# Patient Record
Sex: Female | Born: 1950 | Race: Black or African American | Hispanic: No | Smoking: Never smoker
Health system: Southern US, Community
[De-identification: ages and names within clinical notes are randomized; demographics above are authoritative.]

## PROBLEM LIST (undated history)

## (undated) DIAGNOSIS — I10 Essential (primary) hypertension: Secondary | ICD-10-CM

## (undated) HISTORY — DX: Essential (primary) hypertension: I10

---

## 1999-04-16 ENCOUNTER — Other Ambulatory Visit: Admission: RE | Admit: 1999-04-16 | Discharge: 1999-04-16 | Payer: Self-pay | Admitting: *Deleted

## 2000-06-22 ENCOUNTER — Ambulatory Visit (HOSPITAL_COMMUNITY): Admission: RE | Admit: 2000-06-22 | Discharge: 2000-06-22 | Payer: Self-pay | Admitting: *Deleted

## 2000-06-22 ENCOUNTER — Encounter: Payer: Self-pay | Admitting: *Deleted

## 2000-07-01 ENCOUNTER — Encounter: Payer: Self-pay | Admitting: *Deleted

## 2000-07-01 ENCOUNTER — Ambulatory Visit (HOSPITAL_COMMUNITY): Admission: RE | Admit: 2000-07-01 | Discharge: 2000-07-01 | Payer: Self-pay | Admitting: *Deleted

## 2001-06-09 ENCOUNTER — Other Ambulatory Visit: Admission: RE | Admit: 2001-06-09 | Discharge: 2001-06-09 | Payer: Self-pay | Admitting: *Deleted

## 2002-06-28 ENCOUNTER — Other Ambulatory Visit: Admission: RE | Admit: 2002-06-28 | Discharge: 2002-06-28 | Payer: Self-pay | Admitting: *Deleted

## 2004-01-22 ENCOUNTER — Ambulatory Visit (HOSPITAL_COMMUNITY): Admission: RE | Admit: 2004-01-22 | Discharge: 2004-01-22 | Payer: Self-pay | Admitting: Gastroenterology

## 2004-06-24 ENCOUNTER — Other Ambulatory Visit: Admission: RE | Admit: 2004-06-24 | Discharge: 2004-06-24 | Payer: Self-pay | Admitting: *Deleted

## 2005-10-28 ENCOUNTER — Other Ambulatory Visit: Admission: RE | Admit: 2005-10-28 | Discharge: 2005-10-28 | Payer: Self-pay | Admitting: *Deleted

## 2008-01-21 ENCOUNTER — Other Ambulatory Visit: Admission: RE | Admit: 2008-01-21 | Discharge: 2008-01-21 | Payer: Self-pay | Admitting: Gynecology

## 2010-12-20 NOTE — Op Note (Signed)
NAME:  Amanda Pennington, Amanda Pennington                         ACCOUNT NO.:  [id number]   MEDICAL RECORD NO.:  [id number]                   PATIENT TYPE:  AMB   LOCATION:  ENDO                                 FACILITY:  [facility]   PHYSICIAN:  Anselmo Rod, M.D.               DATE OF BIRTH:  [birth year]   DATE OF PROCEDURE:  01/22/2004  DATE OF DISCHARGE:                                 OPERATIVE REPORT   PROCEDURE PERFORMED:  Screening colonoscopy.   ENDOSCOPIST:  Charna Elizabeth, M.D.   INSTRUMENT USED:  Olympus video colonoscope.   INDICATIONS FOR PROCEDURE:  The patient is a 60 year old African-American  female undergoing screening colonoscopy to rule out colonic polyps, masses,  etc.   PREPROCEDURE PREPARATION:  Informed consent was procured from the patient.  The patient was fasted for eight hours prior to the procedure and prepped  with a bottle of magnesium citrate and a gallon of GoLYTELY the night prior  to the procedure.   PREPROCEDURE PHYSICAL:  The patient had stable vital signs.  Neck supple.  Chest clear to auscultation.  S1 and S2 regular.  Abdomen soft with normal  bowel sounds.   DESCRIPTION OF PROCEDURE:  The patient was placed in left lateral decubitus  position and sedated with 50 mg of Demerol and 7 mg of Versed intravenously.  Once the patient was adequately sedated and maintained on low flow oxygen  and continuous cardiac monitoring, the Olympus video colonoscope was  advanced from the rectum to the cecum.  The appendicular orifice and  ileocecal valve were clearly visualized and photographed.  The patient's  position had to be changed from the left lateral to the supine position with  gentle application of abdominal pressure to reach the cecal base.  The  terminal ileum appeared healthy and without lesion.  Retroflexion in the  rectum revealed no abnormalities.   IMPRESSION:  1. Normal colonoscopy to the terminal ileum.  No masses, polyps, or     diverticula were seen.  2. There was some residual stool in the colon and multiple washes were done.   RECOMMENDATIONS:  1. Repeat colonoscopy is recommended in the next 10 years unless the patient     develops any abnormal symptoms     in the interim.  2. Continue high fiber diet with liberal fluid intake.  3. Outpatient followup as need arises in the future.                                               Anselmo Rod, M.D.    JNM/MEDQ  D:  01/22/2004  T:  01/22/2004  Job:  [id number]   cc:   Talmadge Coventry, M.D.  [address]  [place]  [place] [address]  Fax: [phone]

## 2011-04-23 ENCOUNTER — Other Ambulatory Visit: Payer: Self-pay | Admitting: Family Medicine

## 2011-04-23 DIAGNOSIS — Z780 Asymptomatic menopausal state: Secondary | ICD-10-CM

## 2011-04-25 ENCOUNTER — Ambulatory Visit
Admission: RE | Admit: 2011-04-25 | Discharge: 2011-04-25 | Disposition: A | Discharge status: Home / Self Care | Payer: BC Managed Care – PPO | Source: Ambulatory Visit | Attending: Family Medicine | Admitting: Family Medicine

## 2011-04-25 DIAGNOSIS — Z780 Asymptomatic menopausal state: Secondary | ICD-10-CM

## 2012-01-30 ENCOUNTER — Ambulatory Visit (INDEPENDENT_AMBULATORY_CARE_PROVIDER_SITE_OTHER): Payer: BC Managed Care – PPO | Admitting: Family Medicine

## 2012-01-30 VITALS — BP 118/76 | HR 66 | Temp 98.2°F | Resp 16 | Ht 61.5 in | Wt 200.0 lb

## 2012-01-30 DIAGNOSIS — S61201A Unspecified open wound of left index finger without damage to nail, initial encounter: Secondary | ICD-10-CM

## 2012-01-30 DIAGNOSIS — S61209A Unspecified open wound of unspecified finger without damage to nail, initial encounter: Secondary | ICD-10-CM

## 2012-01-30 DIAGNOSIS — M79609 Pain in unspecified limb: Secondary | ICD-10-CM

## 2012-01-30 DIAGNOSIS — M79646 Pain in unspecified finger(s): Secondary | ICD-10-CM

## 2012-01-30 NOTE — Progress Notes (Signed)
Verbal consent obtained from the patient.  Local anesthesia with 4cc Lidocaine 2% without epinephrine.  Wound scrubbed with soap and water and rinsed.  Wound closed with 5 5-0 Prolene SI sutures.  Wound cleansed and dressed.

## 2012-01-30 NOTE — Patient Instructions (Addendum)

## 2012-01-30 NOTE — Progress Notes (Signed)
Subjective: Patient cut her left index finger with a trimmer.  She thinks her tetanus shot was up-to-date but she does not know that for sure. We will try to check on that.  Objective: Diagnosis slice of the distal pad of her left index finger. It looks fairly superficial. She has full range of motion of the finger. Color is good.  Assessment: Wound left index finger  Plan: Check on tetanus shot and see whether we need to give it. Will require sutures. Sandi Raveling PA we will perform this test.

## 2012-02-06 ENCOUNTER — Ambulatory Visit (INDEPENDENT_AMBULATORY_CARE_PROVIDER_SITE_OTHER): Payer: BC Managed Care – PPO | Admitting: Family Medicine

## 2012-02-06 VITALS — BP 128/82 | HR 65 | Temp 98.3°F | Resp 17 | Ht 62.0 in | Wt 202.0 lb

## 2012-02-06 DIAGNOSIS — Z4802 Encounter for removal of sutures: Secondary | ICD-10-CM

## 2012-02-06 NOTE — Progress Notes (Signed)
  [facility]:  Office Visit  Chief Complaint:  Chief Complaint  Patient presents with  . Suture / Staple Removal    HPI: Amanda Pennington is a 61 y.o. female who complains of : Here for suture removal, no loss of sensation, ROM intact.  Past Medical History  Diagnosis Date  . Hypertension    No past surgical history on file. History   Social History  . Marital Status: Legally Separated    Spouse Name: N/A    Number of Children: N/A  . Years of Education: N/A   Social History Main Topics  . Smoking status: Never Smoker   . Smokeless tobacco: None  . Alcohol Use: None  . Drug Use: None  . Sexually Active: None   Other Topics Concern  . None   Social History Narrative  . None   No family history on file. No Known Allergies Prior to Admission medications   Medication Sig Start Date End Date Taking? Authorizing Provider  amLODipine (NORVASC) 10 MG tablet Take 10 mg by mouth daily.   Yes Historical Provider, MD  Ascorbic Acid (VITAMIN C) 100 MG tablet Take 100 mg by mouth daily.   Yes Historical Provider, MD  aspirin 81 MG tablet Take 81 mg by mouth daily.   Yes Historical Provider, MD  cholecalciferol (VITAMIN D) 1000 UNITS tablet Take 1,000 Units by mouth daily.   Yes Historical Provider, MD  fish oil-omega-3 fatty acids 1000 MG capsule Take 2 g by mouth daily.   Yes Historical Provider, MD  hydrochlorothiazide (MICROZIDE) 12.5 MG capsule Take 12.5 mg by mouth daily.   Yes Historical Provider, MD  vitamin E 100 UNIT capsule Take 100 Units by mouth daily.   Yes Historical Provider, MD     ROS: The patient denies fevers, chills, night sweats, unintentional weight loss, chest pain, palpitations, wheezing, dyspnea on exertion, nausea, vomiting, abdominal pain, dysuria, hematuria, melena, numbness, weakness, or tingling.  All other systems have been reviewed and were otherwise negative with the exception of those mentioned in the HPI and as above.     PHYSICAL EXAM: Filed Vitals:   02/06/12 1317  BP: 128/82  Pulse: 65  Temp: 98.3 F (36.8 C)  Resp: 17   Filed Vitals:   02/06/12 1317  Height: 5\' 2"  (1.575 m)  Weight: 202 lb (91.627 kg)   Body mass index is 36.95 kg/(m^2).  General: Alert, no acute distress HEENT:  Normocephalic, atraumatic, oropharynx patent.  Cardiovascular:  Regular rate and rhythm, no rubs murmurs or gallops.  No Carotid bruits, radial pulse intact. No pedal edema.  Respiratory: Clear to auscultation bilaterally.  No wheezes, rales, or rhonchi.  No cyanosis, no use of accessory musculature GI: No organomegaly, abdomen is soft and non-tender, positive bowel sounds.  No masses. Skin: No rashes. Neurologic: Facial musculature symmetric. Psychiatric: Patient is appropriate throughout our interaction. Lymphatic: No cervical lymphadenopathy Musculoskeletal: Gait intact. Left index finger: Wound healing well. Good approxaimtion, dry no daiange,   LABS: No results found for this or any previous visit.   EKG/XRAY:   Primary read interpreted by Dr. Conley Rolls at [facility].   ASSESSMENT/PLAN: Encounter Diagnosis  Name Primary?  . Visit for suture removal Yes    Sutures removed without complications.   ,  PHUONG, DO 02/08/2012 1:00 PM

## 2012-02-08 ENCOUNTER — Encounter: Payer: Self-pay | Admitting: Family Medicine

## 2017-09-22 ENCOUNTER — Ambulatory Visit: Payer: BC Managed Care – PPO | Admitting: Internal Medicine

## 2017-09-22 ENCOUNTER — Ambulatory Visit (INDEPENDENT_AMBULATORY_CARE_PROVIDER_SITE_OTHER)
Admission: RE | Admit: 2017-09-22 | Discharge: 2017-09-22 | Disposition: A | Discharge status: Home / Self Care | Payer: BC Managed Care – PPO | Source: Ambulatory Visit | Attending: Internal Medicine | Admitting: Internal Medicine

## 2017-09-22 ENCOUNTER — Encounter: Payer: Self-pay | Admitting: Internal Medicine

## 2017-09-22 VITALS — BP 132/82 | HR 84 | Ht 62.0 in | Wt 201.6 lb

## 2017-09-22 DIAGNOSIS — J45991 Cough variant asthma: Secondary | ICD-10-CM | POA: Diagnosis not present

## 2017-09-22 MED ORDER — MOMETASONE FURO-FORMOTEROL FUM 100-5 MCG/ACT IN AERO: 2.0000 | INHALATION_SPRAY | Freq: Two times a day (BID) | RESPIRATORY_TRACT | 11 refills | Status: DC

## 2017-09-22 MED ORDER — MOMETASONE FURO-FORMOTEROL FUM 100-5 MCG/ACT IN AERO: 2.0000 | INHALATION_SPRAY | Freq: Two times a day (BID) | RESPIRATORY_TRACT | 0 refills | Status: DC

## 2017-09-22 NOTE — Progress Notes (Signed)
Subjective:     Patient ID: Amanda Pennington, female   DOB: 06-Dec-1950,    MRN: [id number]  HPI  54 yobf never smoker with allergy upper airway since childhood spring = fall then around late 1990's sob /coughing winter > [place] > dx mold allergy/asthma but p mold removed /  spring / fall cough always better with prednisone. eval by [facility] 01/2017 (neg per pt/ records req) so referred to pulmonary clinic 09/22/2017 by Dr   Kateri Plummer   09/22/2017 1st [facility] office visit/ Amanda Pennington   Chief Complaint  Patient presents with  . Pulmonary Consult    Referred by Dr. Farris Has. Pt c/o occ DOE. She has had a cough in the spring time for several years.    periodic sob  Usually assoc with cough last time while in [place] fall 2018  Zyrtec working plus flonase to control nasal symptoms and states presently not sob or coughing but wants to know how to handle it in future/ does not have inhaler.  No obvious day to day or daytime variability or assoc excess/ purulent sputum or mucus plugs or hemoptysis or cp or chest tightness, subjective wheeze or overt sinus or hb symptoms. No unusual exposure hx or h/o childhood pna/ asthma or knowledge of premature birth.  Sleeping ok flat without nocturnal  or early am exacerbation  of respiratory  c/o's or need for noct saba. Also denies any obvious fluctuation of symptoms with weather or environmental changes or other aggravating or alleviating factors except as outlined above   Current Allergies, Complete Past Medical History, Past Surgical History, Family History, and Social History were reviewed in [employer] record.  ROS  The following are not active complaints unless bolded Hoarseness, sore throat, dysphagia, dental problems, itching, sneezing,  nasal congestion or discharge of excess mucus or purulent secretions, ear ache,   fever, chills, sweats, unintended wt loss or wt gain, classically pleuritic or exertional cp,  orthopnea pnd  or leg swelling, presyncope, palpitations, abdominal pain, anorexia, nausea, vomiting, diarrhea  or change in bowel habits or change in bladder habits, change in stools or change in urine, dysuria, hematuria,  rash, arthralgias, visual complaints, headache, numbness, weakness or ataxia or problems with walking or coordination,  change in mood/affect or memory.        Current Meds  Medication Sig  . Ascorbic Acid (VITAMIN C) 100 MG tablet Take 100 mg by mouth daily.  Marland Kitchen aspirin 81 MG tablet Take 81 mg by mouth daily.  . B Complex-C (SUPER B COMPLEX PO) Take 1 capsule by mouth daily.  . cetirizine (ZYRTEC) 10 MG tablet Take 10 mg by mouth daily.  . cholecalciferol (VITAMIN D) 1000 UNITS tablet Take 1,000 Units by mouth daily.  . Cyanocobalamin (B-12 PO) Take 1 tablet by mouth daily.  . Flaxseed, Linseed, (FLAXSEED OIL PO) Take 1 capsule by mouth daily.  . fluticasone (FLONASE) 50 MCG/ACT nasal spray Place 2 sprays into both nostrils daily.  Marland Kitchen FOLIC ACID PO Take 1 tablet by mouth daily.  Marland Kitchen losartan-hydrochlorothiazide (HYZAAR) 50-12.5 MG tablet Take 1 tablet by mouth daily.  . vitamin A 78295 UNIT capsule Take 10,000 Units by mouth daily.  . vitamin E 100 UNIT capsule Take 100 Units by mouth daily.  Marland Kitchen zinc gluconate 50 MG tablet Take 50 mg by mouth daily.                  Review of Systems  Objective:   Physical Exam    amb bf nad   Wt Readings from Last 3 Encounters:  09/22/17 201 lb 9.6 oz (91.4 kg)  02/06/12 202 lb (91.6 kg)  01/30/12 200 lb (90.7 kg)     Vital signs reviewed - Note on arrival 02 sats  97% on RA   HEENT: nl dentition, turbinates bilaterally, and oropharynx. Nl external ear canals without cough reflex   NECK :  without JVD/Nodes/TM/ nl carotid upstrokes bilaterally   LUNGS: no acc muscle use,  Nl contour chest which is clear to A and P bilaterally without cough on insp or exp maneuvers   CV:  RRR  no s3 or murmur or increase in P2, and no  edema   ABD:  soft and nontender with nl inspiratory excursion in the supine position. No bruits or organomegaly appreciated, bowel sounds nl  MS:  Nl gait/ ext warm without deformities, calf tenderness, cyanosis or clubbing No obvious joint restrictions   SKIN: warm and dry without lesions    NEURO:  alert, approp, nl sensorium with  no motor or cerebellar deficits apparent.    CXR PA and Lateral:   09/22/2017 :    I personally reviewed images and agree with radiology impression as follows:    Shallow degree of inspiration. Left base atelectasis. Lungs elsewhere clear. There is aortic atherosclerosis.  My review: poor inspiration/ no findings       Assessment:

## 2017-09-22 NOTE — Patient Instructions (Addendum)
For next flare rec trial of dulera 100  up to 2 puffs every 12 hours if needed for cough or short of breath   Work on inhaler technique:  relax and gently blow all the way out then take a nice smooth deep breath back in, triggering the inhaler at same time you start breathing in.  Hold for up to 5 seconds if you can. Blow out thru nose. Rinse and gargle with water when done   Please remember to go to the  x-ray department downstairs in the basement  for your tests - we will call you with the results when they are available.  Please sign release for your allergy evaluation in 2018 so we can have a record of it here

## 2017-09-23 ENCOUNTER — Encounter: Payer: Self-pay | Admitting: Internal Medicine

## 2017-09-23 NOTE — Assessment & Plan Note (Addendum)
Spirometry 09/22/2017  Nl with nl curvature to fv FENO 09/22/2017  =   Not able to do - 09/22/2017  After extensive coaching inhaler device  effectiveness =    75% from baselin 25% so try dulera 100 2bid @ next flare of symptoms   Allergy Eval June 2018 "neg" per pt/ req 09/22/2017     The only abn today is small lungs on cxr but not lung vol nl on spirometry so suspect this was effort related.   She likely has intermittent asthma / cough variant triggered by rhinitis/ pnds/ ? Allergy vs viral vs irritant but in any case best option is low dose dulera or symb prn  Based on the study from NEJM  378; 20 p 1865 (2018) in pts with mild asthma it is reasonable to use low dose symbicort eg 80 2bid "prn" flare in this setting but I emphasized this was only shown with symbicort and takes advantage of the rapid onset of action (of formoterol component, same as in dulera)  but is not the same as "rescue therapy" but can be stopped once the acute symptoms have resolved and the need for rescue has been minimized (< 2 x weekly)    - The proper method of use, as well as anticipated side effects, of a metered-dose inhaler are discussed and demonstrated to the patient. Improved effectiveness after extensive coaching during this visit to a level of approximately 75 % from a baseline of 25 % so if not doing well first step is verify approp use of hfa   Total time devoted to counseling  > 50 % of initial 60 min office visit:  review case with pt/ discussion of options/alternatives/ personally creating written customized instructions  in presence of pt  then going over those specific  Instructions directly with the pt including how to use all of the meds but in particular covering each new medication in detail and the difference between the maintenance= "automatic" meds and the prns using an action plan format for the latter (If this problem/symptom => do that organization reading Left to right).  Please see AVS from this  visit for a full list of these instructions which I personally wrote for this pt and  are unique to this visit.

## 2017-09-23 NOTE — Progress Notes (Signed)
Spoke with pt and notified of results per Dr. Wert. Pt verbalized understanding and denied any questions. 

## 2017-12-02 ENCOUNTER — Telehealth: Payer: Self-pay

## 2017-12-02 DIAGNOSIS — J45991 Cough variant asthma: Secondary | ICD-10-CM

## 2017-12-02 MED ORDER — MOMETASONE FURO-FORMOTEROL FUM 100-5 MCG/ACT IN AERO: 2.0000 | INHALATION_SPRAY | Freq: Two times a day (BID) | RESPIRATORY_TRACT | 11 refills | Status: AC

## 2017-12-02 NOTE — Telephone Encounter (Signed)
Called and spoke with Dora. PA was initiaVerlee Monteed over the phone. Clinical information given and questions answered. Advised that patient has tried and done well on the [facility] inhaler. Medication has been approved through 5.1.20  Case number [id number]  Called and advised pharmacy that medication was approved. Advised patient that this was approved and sent in. Nothing further needed.

## 2018-09-20 IMAGING — DX DG CHEST 2V
2 series · 2 of 2 positions shown · non-contrast
Comparison: None.

CLINICAL DATA: Shortness of breath.  Hypertension.  Cough.

EXAM:
CHEST  2 VIEW

[chest pa]
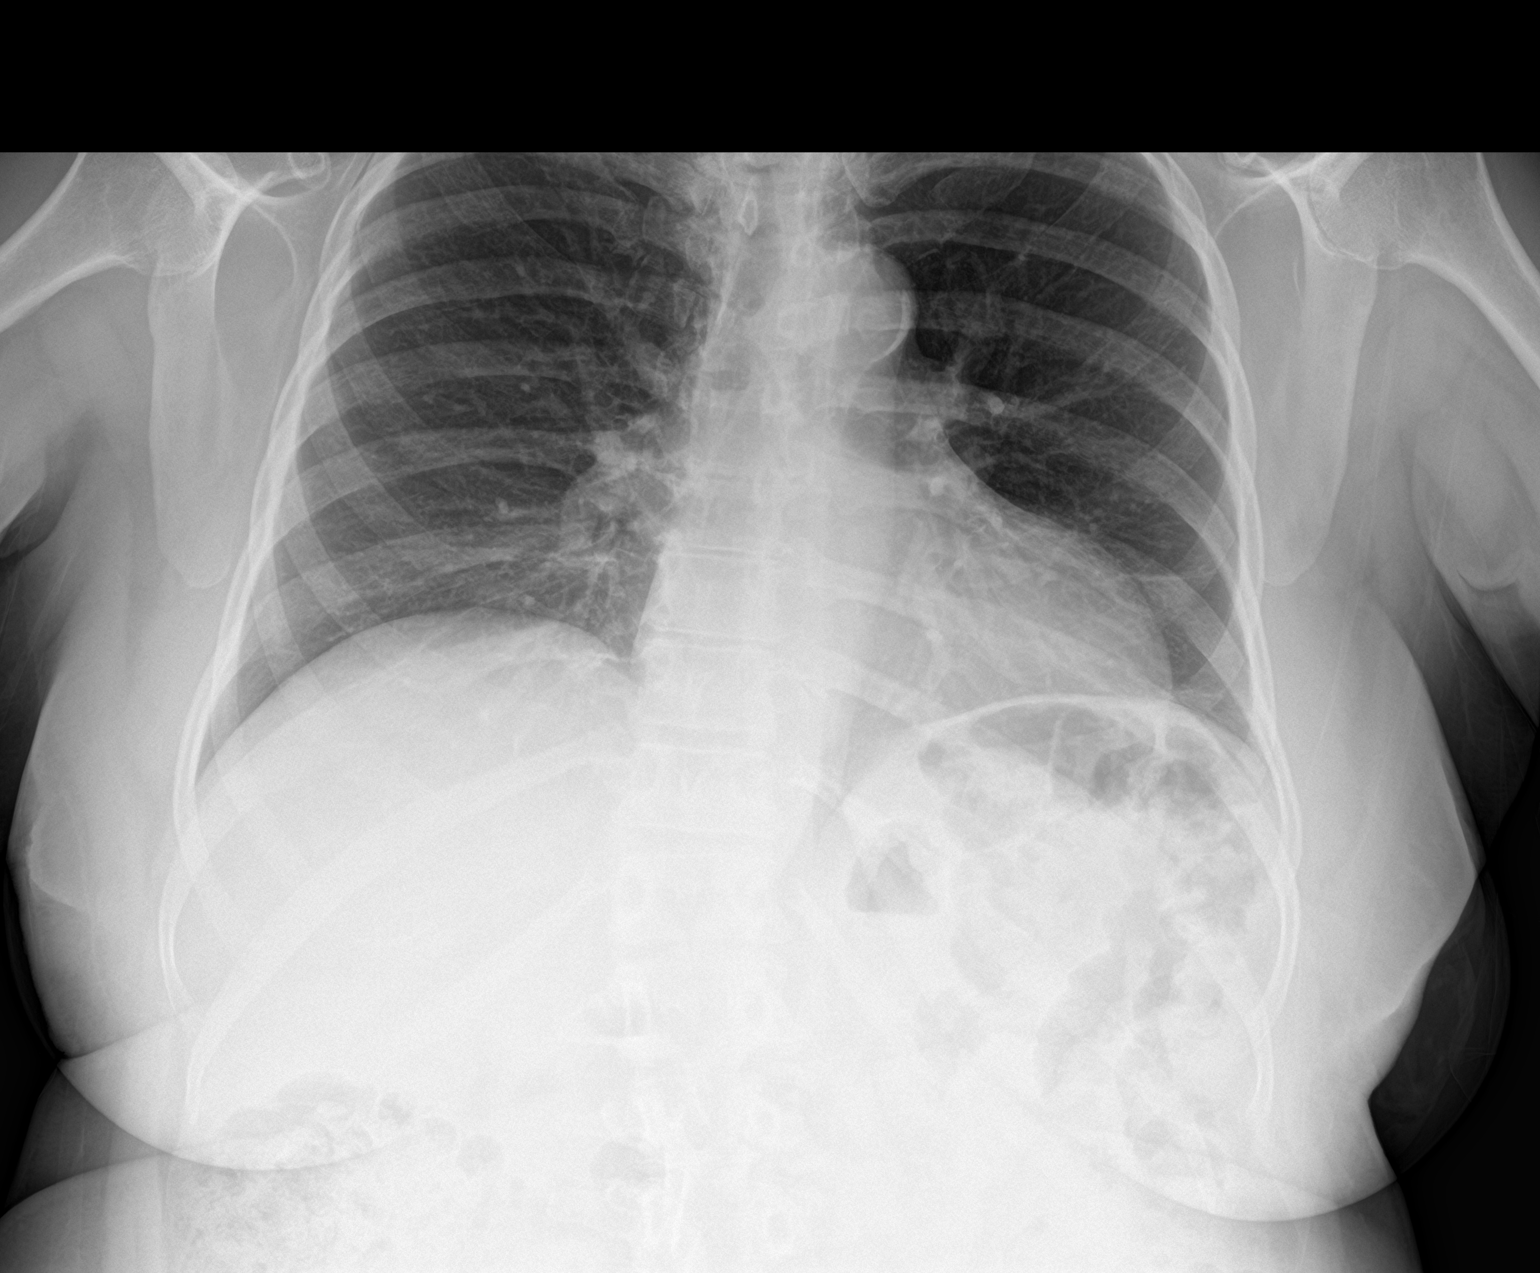

[chest lat]
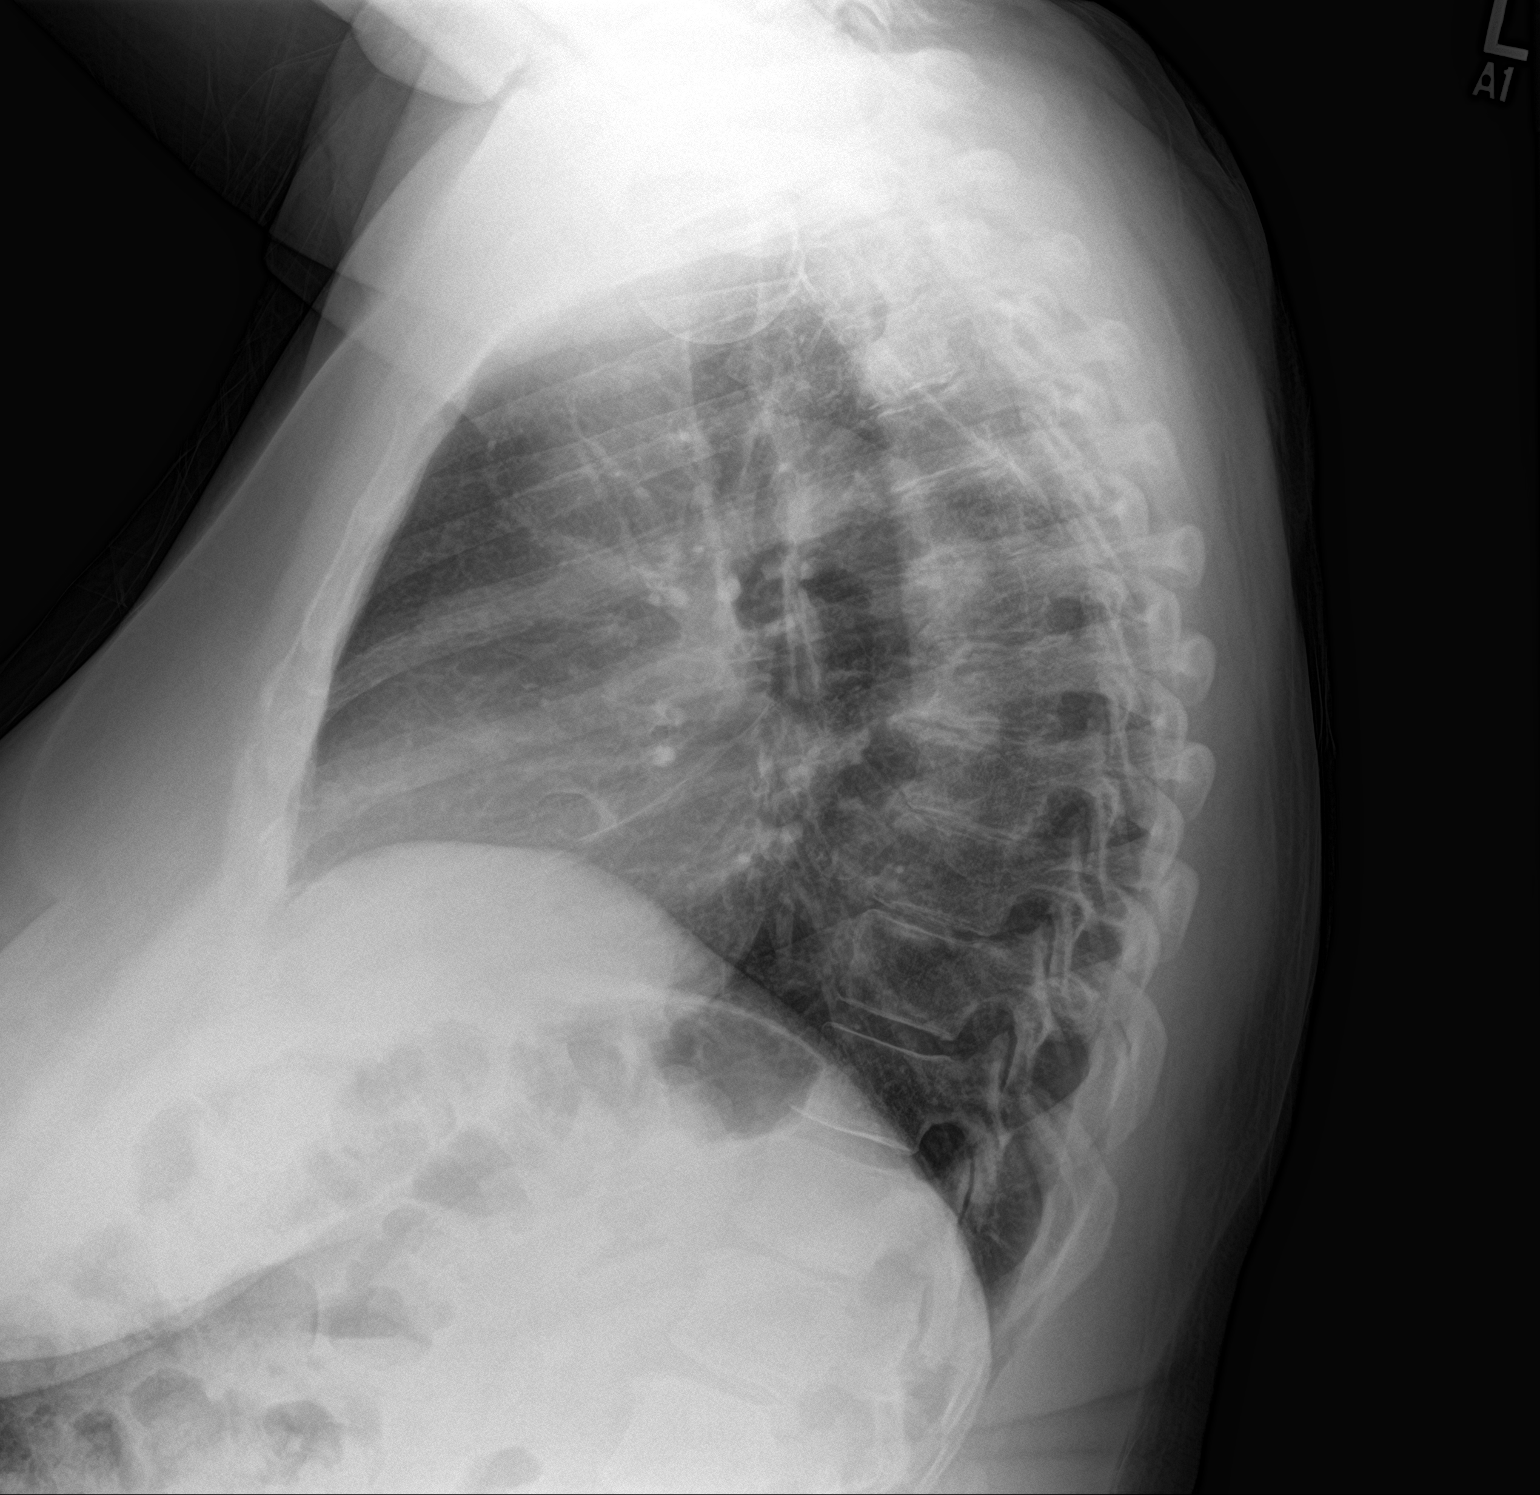

[2 of 2 positions shown; findings below may reference images not displayed]

FINDINGS: Degree of inspiration is shallow. There is atelectatic change in the
left base. Lungs elsewhere are clear. Heart size and pulmonary
vascularity are normal. No adenopathy. There is aortic
atherosclerosis. There is degenerative change in the thoracic spine.
IMPRESSION: Shallow degree of inspiration. Left base atelectasis. Lungs
elsewhere clear. There is aortic atherosclerosis.

Aortic Atherosclerosis ([id number]-LOI.I).

## 2020-04-30 ENCOUNTER — Encounter (INDEPENDENT_AMBULATORY_CARE_PROVIDER_SITE_OTHER): Payer: Self-pay

## 2020-04-30 DIAGNOSIS — Z20828 Contact with and (suspected) exposure to other viral communicable diseases: Secondary | ICD-10-CM | POA: Diagnosis not present

## 2020-05-03 DIAGNOSIS — L80 Vitiligo: Secondary | ICD-10-CM | POA: Diagnosis not present

## 2020-05-03 DIAGNOSIS — L821 Other seborrheic keratosis: Secondary | ICD-10-CM | POA: Diagnosis not present

## 2020-06-04 DIAGNOSIS — Z6832 Body mass index (BMI) 32.0-32.9, adult: Secondary | ICD-10-CM | POA: Diagnosis not present

## 2020-06-04 DIAGNOSIS — Z779 Other contact with and (suspected) exposures hazardous to health: Secondary | ICD-10-CM | POA: Diagnosis not present

## 2020-06-04 DIAGNOSIS — Z1231 Encounter for screening mammogram for malignant neoplasm of breast: Secondary | ICD-10-CM | POA: Diagnosis not present

## 2020-06-05 DIAGNOSIS — Z1272 Encounter for screening for malignant neoplasm of vagina: Secondary | ICD-10-CM | POA: Diagnosis not present

## 2020-07-26 DIAGNOSIS — L80 Vitiligo: Secondary | ICD-10-CM | POA: Diagnosis not present

## 2020-08-06 DIAGNOSIS — I10 Essential (primary) hypertension: Secondary | ICD-10-CM | POA: Diagnosis not present

## 2020-08-06 DIAGNOSIS — L80 Vitiligo: Secondary | ICD-10-CM | POA: Diagnosis not present

## 2020-08-06 DIAGNOSIS — J4520 Mild intermittent asthma, uncomplicated: Secondary | ICD-10-CM | POA: Diagnosis not present

## 2020-08-06 DIAGNOSIS — R7303 Prediabetes: Secondary | ICD-10-CM | POA: Diagnosis not present

## 2020-10-25 DIAGNOSIS — L80 Vitiligo: Secondary | ICD-10-CM | POA: Diagnosis not present

## 2021-02-11 DIAGNOSIS — I10 Essential (primary) hypertension: Secondary | ICD-10-CM | POA: Diagnosis not present

## 2021-02-11 DIAGNOSIS — Z0000 Encounter for general adult medical examination without abnormal findings: Secondary | ICD-10-CM | POA: Diagnosis not present

## 2021-02-11 DIAGNOSIS — R7303 Prediabetes: Secondary | ICD-10-CM | POA: Diagnosis not present

## 2021-02-11 DIAGNOSIS — J4520 Mild intermittent asthma, uncomplicated: Secondary | ICD-10-CM | POA: Diagnosis not present

## 2021-02-11 DIAGNOSIS — Z1211 Encounter for screening for malignant neoplasm of colon: Secondary | ICD-10-CM | POA: Diagnosis not present

## 2021-02-20 DIAGNOSIS — Z1211 Encounter for screening for malignant neoplasm of colon: Secondary | ICD-10-CM | POA: Diagnosis not present

## 2021-03-07 DIAGNOSIS — L80 Vitiligo: Secondary | ICD-10-CM | POA: Diagnosis not present

## 2021-07-11 DIAGNOSIS — N952 Postmenopausal atrophic vaginitis: Secondary | ICD-10-CM | POA: Diagnosis not present

## 2021-07-11 DIAGNOSIS — N950 Postmenopausal bleeding: Secondary | ICD-10-CM | POA: Diagnosis not present

## 2021-07-11 DIAGNOSIS — Z1231 Encounter for screening mammogram for malignant neoplasm of breast: Secondary | ICD-10-CM | POA: Diagnosis not present

## 2021-07-11 DIAGNOSIS — Z124 Encounter for screening for malignant neoplasm of cervix: Secondary | ICD-10-CM | POA: Diagnosis not present

## 2021-07-11 DIAGNOSIS — Z6830 Body mass index (BMI) 30.0-30.9, adult: Secondary | ICD-10-CM | POA: Diagnosis not present

## 2021-07-12 DIAGNOSIS — Z1272 Encounter for screening for malignant neoplasm of vagina: Secondary | ICD-10-CM | POA: Diagnosis not present

## 2021-08-22 DIAGNOSIS — R87610 Atypical squamous cells of undetermined significance on cytologic smear of cervix (ASC-US): Secondary | ICD-10-CM | POA: Diagnosis not present

## 2021-08-22 DIAGNOSIS — R87619 Unspecified abnormal cytological findings in specimens from cervix uteri: Secondary | ICD-10-CM | POA: Diagnosis not present

## 2021-08-27 DIAGNOSIS — U071 COVID-19: Secondary | ICD-10-CM | POA: Diagnosis not present

## 2021-09-13 DIAGNOSIS — I700 Atherosclerosis of aorta: Secondary | ICD-10-CM | POA: Diagnosis not present

## 2021-09-13 DIAGNOSIS — I10 Essential (primary) hypertension: Secondary | ICD-10-CM | POA: Diagnosis not present

## 2021-09-13 DIAGNOSIS — R7303 Prediabetes: Secondary | ICD-10-CM | POA: Diagnosis not present

## 2021-09-13 DIAGNOSIS — R413 Other amnesia: Secondary | ICD-10-CM | POA: Diagnosis not present

## 2022-02-07 DIAGNOSIS — W57XXXA Bitten or stung by nonvenomous insect and other nonvenomous arthropods, initial encounter: Secondary | ICD-10-CM | POA: Diagnosis not present

## 2022-02-07 DIAGNOSIS — S70269A Insect bite (nonvenomous), unspecified hip, initial encounter: Secondary | ICD-10-CM | POA: Diagnosis not present

## 2022-02-14 DIAGNOSIS — J4520 Mild intermittent asthma, uncomplicated: Secondary | ICD-10-CM | POA: Diagnosis not present

## 2022-02-14 DIAGNOSIS — I10 Essential (primary) hypertension: Secondary | ICD-10-CM | POA: Diagnosis not present

## 2022-02-14 DIAGNOSIS — Z0000 Encounter for general adult medical examination without abnormal findings: Secondary | ICD-10-CM | POA: Diagnosis not present

## 2022-02-14 DIAGNOSIS — Z5181 Encounter for therapeutic drug level monitoring: Secondary | ICD-10-CM | POA: Diagnosis not present

## 2022-02-14 DIAGNOSIS — R7303 Prediabetes: Secondary | ICD-10-CM | POA: Diagnosis not present

## 2022-02-14 DIAGNOSIS — Z1211 Encounter for screening for malignant neoplasm of colon: Secondary | ICD-10-CM | POA: Diagnosis not present

## 2022-02-21 DIAGNOSIS — N890 Mild vaginal dysplasia: Secondary | ICD-10-CM | POA: Diagnosis not present

## 2022-03-06 DIAGNOSIS — L80 Vitiligo: Secondary | ICD-10-CM | POA: Diagnosis not present

## 2022-03-10 DIAGNOSIS — R600 Localized edema: Secondary | ICD-10-CM | POA: Diagnosis not present

## 2022-03-10 DIAGNOSIS — I781 Nevus, non-neoplastic: Secondary | ICD-10-CM | POA: Diagnosis not present

## 2022-06-06 DIAGNOSIS — Z1211 Encounter for screening for malignant neoplasm of colon: Secondary | ICD-10-CM | POA: Diagnosis not present

## 2022-07-05 DIAGNOSIS — J018 Other acute sinusitis: Secondary | ICD-10-CM | POA: Diagnosis not present

## 2022-07-17 DIAGNOSIS — N958 Other specified menopausal and perimenopausal disorders: Secondary | ICD-10-CM | POA: Diagnosis not present

## 2022-07-17 DIAGNOSIS — Z1231 Encounter for screening mammogram for malignant neoplasm of breast: Secondary | ICD-10-CM | POA: Diagnosis not present

## 2022-07-17 DIAGNOSIS — Z124 Encounter for screening for malignant neoplasm of cervix: Secondary | ICD-10-CM | POA: Diagnosis not present

## 2022-07-17 DIAGNOSIS — Z13820 Encounter for screening for osteoporosis: Secondary | ICD-10-CM | POA: Diagnosis not present

## 2022-07-17 DIAGNOSIS — Z6831 Body mass index (BMI) 31.0-31.9, adult: Secondary | ICD-10-CM | POA: Diagnosis not present

## 2022-07-17 DIAGNOSIS — R29890 Loss of height: Secondary | ICD-10-CM | POA: Diagnosis not present

## 2022-07-17 DIAGNOSIS — N952 Postmenopausal atrophic vaginitis: Secondary | ICD-10-CM | POA: Diagnosis not present

## 2022-07-17 DIAGNOSIS — N890 Mild vaginal dysplasia: Secondary | ICD-10-CM | POA: Diagnosis not present

## 2022-08-15 DIAGNOSIS — L80 Vitiligo: Secondary | ICD-10-CM | POA: Diagnosis not present

## 2022-08-15 DIAGNOSIS — R7303 Prediabetes: Secondary | ICD-10-CM | POA: Diagnosis not present

## 2022-08-15 DIAGNOSIS — J309 Allergic rhinitis, unspecified: Secondary | ICD-10-CM | POA: Diagnosis not present

## 2022-08-15 DIAGNOSIS — J4520 Mild intermittent asthma, uncomplicated: Secondary | ICD-10-CM | POA: Diagnosis not present

## 2022-08-15 DIAGNOSIS — I700 Atherosclerosis of aorta: Secondary | ICD-10-CM | POA: Diagnosis not present

## 2022-08-15 DIAGNOSIS — I10 Essential (primary) hypertension: Secondary | ICD-10-CM | POA: Diagnosis not present

## 2022-10-14 DIAGNOSIS — S40012A Contusion of left shoulder, initial encounter: Secondary | ICD-10-CM | POA: Diagnosis not present

## 2023-02-20 DIAGNOSIS — Z5181 Encounter for therapeutic drug level monitoring: Secondary | ICD-10-CM | POA: Diagnosis not present

## 2023-02-20 DIAGNOSIS — R7303 Prediabetes: Secondary | ICD-10-CM | POA: Diagnosis not present

## 2023-02-20 DIAGNOSIS — I10 Essential (primary) hypertension: Secondary | ICD-10-CM | POA: Diagnosis not present

## 2023-02-20 DIAGNOSIS — Z0000 Encounter for general adult medical examination without abnormal findings: Secondary | ICD-10-CM | POA: Diagnosis not present

## 2023-02-20 DIAGNOSIS — Z1211 Encounter for screening for malignant neoplasm of colon: Secondary | ICD-10-CM | POA: Diagnosis not present

## 2023-02-20 DIAGNOSIS — J4520 Mild intermittent asthma, uncomplicated: Secondary | ICD-10-CM | POA: Diagnosis not present

## 2023-02-23 DIAGNOSIS — H52223 Regular astigmatism, bilateral: Secondary | ICD-10-CM | POA: Diagnosis not present

## 2023-02-23 DIAGNOSIS — H2510 Age-related nuclear cataract, unspecified eye: Secondary | ICD-10-CM | POA: Diagnosis not present

## 2023-02-23 DIAGNOSIS — H524 Presbyopia: Secondary | ICD-10-CM | POA: Diagnosis not present

## 2023-03-06 DIAGNOSIS — Z20822 Contact with and (suspected) exposure to covid-19: Secondary | ICD-10-CM | POA: Diagnosis not present

## 2023-04-17 DIAGNOSIS — N893 Dysplasia of vagina, unspecified: Secondary | ICD-10-CM | POA: Diagnosis not present

## 2023-05-11 DIAGNOSIS — N871 Moderate cervical dysplasia: Secondary | ICD-10-CM | POA: Diagnosis not present

## 2023-05-11 DIAGNOSIS — N898 Other specified noninflammatory disorders of vagina: Secondary | ICD-10-CM | POA: Diagnosis not present

## 2023-07-22 DIAGNOSIS — N890 Mild vaginal dysplasia: Secondary | ICD-10-CM | POA: Diagnosis not present

## 2023-07-22 DIAGNOSIS — Z6831 Body mass index (BMI) 31.0-31.9, adult: Secondary | ICD-10-CM | POA: Diagnosis not present

## 2023-07-22 DIAGNOSIS — Z779 Other contact with and (suspected) exposures hazardous to health: Secondary | ICD-10-CM | POA: Diagnosis not present

## 2023-07-22 DIAGNOSIS — R87622 Low grade squamous intraepithelial lesion on cytologic smear of vagina (LGSIL): Secondary | ICD-10-CM | POA: Diagnosis not present

## 2023-07-22 DIAGNOSIS — Z1211 Encounter for screening for malignant neoplasm of colon: Secondary | ICD-10-CM | POA: Diagnosis not present

## 2023-07-22 DIAGNOSIS — Z1231 Encounter for screening mammogram for malignant neoplasm of breast: Secondary | ICD-10-CM | POA: Diagnosis not present

## 2023-07-22 DIAGNOSIS — Z1272 Encounter for screening for malignant neoplasm of vagina: Secondary | ICD-10-CM | POA: Diagnosis not present

## 2023-08-26 DIAGNOSIS — J4520 Mild intermittent asthma, uncomplicated: Secondary | ICD-10-CM | POA: Diagnosis not present

## 2023-08-26 DIAGNOSIS — I10 Essential (primary) hypertension: Secondary | ICD-10-CM | POA: Diagnosis not present

## 2023-08-26 DIAGNOSIS — R7303 Prediabetes: Secondary | ICD-10-CM | POA: Diagnosis not present

## 2024-01-29 DIAGNOSIS — R875 Abnormal microbiological findings in specimens from female genital organs: Secondary | ICD-10-CM | POA: Diagnosis not present

## 2024-01-29 DIAGNOSIS — R87610 Atypical squamous cells of undetermined significance on cytologic smear of cervix (ASC-US): Secondary | ICD-10-CM | POA: Diagnosis not present

## 2024-01-29 DIAGNOSIS — R87622 Low grade squamous intraepithelial lesion on cytologic smear of vagina (LGSIL): Secondary | ICD-10-CM | POA: Diagnosis not present

## 2024-01-29 DIAGNOSIS — N893 Dysplasia of vagina, unspecified: Secondary | ICD-10-CM | POA: Diagnosis not present

## 2024-02-18 DIAGNOSIS — M79671 Pain in right foot: Secondary | ICD-10-CM | POA: Diagnosis not present

## 2024-02-29 DIAGNOSIS — Z23 Encounter for immunization: Secondary | ICD-10-CM | POA: Diagnosis not present

## 2024-02-29 DIAGNOSIS — Z5181 Encounter for therapeutic drug level monitoring: Secondary | ICD-10-CM | POA: Diagnosis not present

## 2024-02-29 DIAGNOSIS — J4520 Mild intermittent asthma, uncomplicated: Secondary | ICD-10-CM | POA: Diagnosis not present

## 2024-02-29 DIAGNOSIS — R011 Cardiac murmur, unspecified: Secondary | ICD-10-CM | POA: Diagnosis not present

## 2024-02-29 DIAGNOSIS — R7303 Prediabetes: Secondary | ICD-10-CM | POA: Diagnosis not present

## 2024-02-29 DIAGNOSIS — Z0000 Encounter for general adult medical examination without abnormal findings: Secondary | ICD-10-CM | POA: Diagnosis not present

## 2024-02-29 DIAGNOSIS — I10 Essential (primary) hypertension: Secondary | ICD-10-CM | POA: Diagnosis not present

## 2024-03-03 DIAGNOSIS — H35033 Hypertensive retinopathy, bilateral: Secondary | ICD-10-CM | POA: Diagnosis not present

## 2024-03-03 DIAGNOSIS — H3561 Retinal hemorrhage, right eye: Secondary | ICD-10-CM | POA: Diagnosis not present

## 2024-03-10 DIAGNOSIS — R946 Abnormal results of thyroid function studies: Secondary | ICD-10-CM | POA: Diagnosis not present

## 2024-03-10 DIAGNOSIS — R7989 Other specified abnormal findings of blood chemistry: Secondary | ICD-10-CM | POA: Diagnosis not present

## 2024-03-10 DIAGNOSIS — R7303 Prediabetes: Secondary | ICD-10-CM | POA: Diagnosis not present

## 2024-03-28 DIAGNOSIS — R011 Cardiac murmur, unspecified: Secondary | ICD-10-CM | POA: Diagnosis not present

## 2024-04-20 DIAGNOSIS — L432 Lichenoid drug reaction: Secondary | ICD-10-CM | POA: Diagnosis not present

## 2024-04-26 DIAGNOSIS — E0590 Thyrotoxicosis, unspecified without thyrotoxic crisis or storm: Secondary | ICD-10-CM | POA: Diagnosis not present

## 2024-04-28 DIAGNOSIS — K5730 Diverticulosis of large intestine without perforation or abscess without bleeding: Secondary | ICD-10-CM | POA: Diagnosis not present

## 2024-04-28 DIAGNOSIS — Z1211 Encounter for screening for malignant neoplasm of colon: Secondary | ICD-10-CM | POA: Diagnosis not present

## 2024-06-06 DIAGNOSIS — K6289 Other specified diseases of anus and rectum: Secondary | ICD-10-CM | POA: Diagnosis not present

## 2024-06-06 DIAGNOSIS — K5730 Diverticulosis of large intestine without perforation or abscess without bleeding: Secondary | ICD-10-CM | POA: Diagnosis not present

## 2024-06-06 DIAGNOSIS — Z1211 Encounter for screening for malignant neoplasm of colon: Secondary | ICD-10-CM | POA: Diagnosis not present
